# Patient Record
Sex: Female | Born: 2000 | Race: White | Hispanic: No | Marital: Single | Smoking: Never smoker
Health system: Southern US, Community
[De-identification: ages and names within clinical notes are randomized; demographics above are authoritative.]

## PROBLEM LIST (undated history)

## (undated) DIAGNOSIS — T7840XA Allergy, unspecified, initial encounter: Secondary | ICD-10-CM

## (undated) DIAGNOSIS — J45909 Unspecified asthma, uncomplicated: Secondary | ICD-10-CM

## (undated) DIAGNOSIS — F419 Anxiety disorder, unspecified: Secondary | ICD-10-CM

## (undated) HISTORY — DX: Unspecified asthma, uncomplicated: J45.909

## (undated) HISTORY — PX: SPINE SURGERY: SHX786

## (undated) HISTORY — DX: Allergy, unspecified, initial encounter: T78.40XA

## (undated) HISTORY — DX: Anxiety disorder, unspecified: F41.9

## (undated) HISTORY — PX: FRACTURE SURGERY: SHX138

## (undated) HISTORY — PX: BACK SURGERY: SHX140

---
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
## 2019-08-27 DIAGNOSIS — F419 Anxiety disorder, unspecified: Secondary | ICD-10-CM | POA: Diagnosis not present

## 2019-09-04 DIAGNOSIS — F419 Anxiety disorder, unspecified: Secondary | ICD-10-CM | POA: Diagnosis not present

## 2019-09-26 DIAGNOSIS — F419 Anxiety disorder, unspecified: Secondary | ICD-10-CM | POA: Diagnosis not present

## 2019-10-02 DIAGNOSIS — F419 Anxiety disorder, unspecified: Secondary | ICD-10-CM | POA: Diagnosis not present

## 2019-10-09 DIAGNOSIS — F419 Anxiety disorder, unspecified: Secondary | ICD-10-CM | POA: Diagnosis not present

## 2019-10-16 DIAGNOSIS — F419 Anxiety disorder, unspecified: Secondary | ICD-10-CM | POA: Diagnosis not present

## 2019-11-05 DIAGNOSIS — F419 Anxiety disorder, unspecified: Secondary | ICD-10-CM | POA: Diagnosis not present

## 2019-11-12 DIAGNOSIS — F419 Anxiety disorder, unspecified: Secondary | ICD-10-CM | POA: Diagnosis not present

## 2019-12-03 DIAGNOSIS — F419 Anxiety disorder, unspecified: Secondary | ICD-10-CM | POA: Diagnosis not present

## 2019-12-09 DIAGNOSIS — F419 Anxiety disorder, unspecified: Secondary | ICD-10-CM | POA: Diagnosis not present

## 2020-01-07 DIAGNOSIS — F419 Anxiety disorder, unspecified: Secondary | ICD-10-CM | POA: Diagnosis not present

## 2020-01-21 DIAGNOSIS — M9902 Segmental and somatic dysfunction of thoracic region: Secondary | ICD-10-CM | POA: Diagnosis not present

## 2020-01-21 DIAGNOSIS — M9901 Segmental and somatic dysfunction of cervical region: Secondary | ICD-10-CM | POA: Diagnosis not present

## 2020-01-21 DIAGNOSIS — M9905 Segmental and somatic dysfunction of pelvic region: Secondary | ICD-10-CM | POA: Diagnosis not present

## 2020-01-21 DIAGNOSIS — M9903 Segmental and somatic dysfunction of lumbar region: Secondary | ICD-10-CM | POA: Diagnosis not present

## 2020-01-24 DIAGNOSIS — M9905 Segmental and somatic dysfunction of pelvic region: Secondary | ICD-10-CM | POA: Diagnosis not present

## 2020-01-24 DIAGNOSIS — M9902 Segmental and somatic dysfunction of thoracic region: Secondary | ICD-10-CM | POA: Diagnosis not present

## 2020-01-24 DIAGNOSIS — M9903 Segmental and somatic dysfunction of lumbar region: Secondary | ICD-10-CM | POA: Diagnosis not present

## 2020-01-24 DIAGNOSIS — M9901 Segmental and somatic dysfunction of cervical region: Secondary | ICD-10-CM | POA: Diagnosis not present

## 2020-01-28 DIAGNOSIS — F419 Anxiety disorder, unspecified: Secondary | ICD-10-CM | POA: Diagnosis not present

## 2020-02-04 DIAGNOSIS — F419 Anxiety disorder, unspecified: Secondary | ICD-10-CM | POA: Diagnosis not present

## 2020-02-18 DIAGNOSIS — M545 Low back pain, unspecified: Secondary | ICD-10-CM | POA: Insufficient documentation

## 2020-02-18 DIAGNOSIS — F419 Anxiety disorder, unspecified: Secondary | ICD-10-CM | POA: Diagnosis not present

## 2020-02-28 DIAGNOSIS — M545 Low back pain: Secondary | ICD-10-CM | POA: Diagnosis not present

## 2020-03-03 DIAGNOSIS — F419 Anxiety disorder, unspecified: Secondary | ICD-10-CM | POA: Diagnosis not present

## 2020-03-04 DIAGNOSIS — M545 Low back pain: Secondary | ICD-10-CM | POA: Diagnosis not present

## 2020-03-04 DIAGNOSIS — M5126 Other intervertebral disc displacement, lumbar region: Secondary | ICD-10-CM | POA: Diagnosis not present

## 2020-03-17 DIAGNOSIS — M5417 Radiculopathy, lumbosacral region: Secondary | ICD-10-CM | POA: Diagnosis not present

## 2020-03-17 DIAGNOSIS — M5127 Other intervertebral disc displacement, lumbosacral region: Secondary | ICD-10-CM | POA: Diagnosis not present

## 2020-03-25 DIAGNOSIS — M5117 Intervertebral disc disorders with radiculopathy, lumbosacral region: Secondary | ICD-10-CM | POA: Diagnosis not present

## 2020-03-25 DIAGNOSIS — M5127 Other intervertebral disc displacement, lumbosacral region: Secondary | ICD-10-CM | POA: Diagnosis not present

## 2020-04-16 DIAGNOSIS — F419 Anxiety disorder, unspecified: Secondary | ICD-10-CM | POA: Diagnosis not present

## 2020-04-22 DIAGNOSIS — F419 Anxiety disorder, unspecified: Secondary | ICD-10-CM | POA: Diagnosis not present

## 2020-04-30 DIAGNOSIS — B078 Other viral warts: Secondary | ICD-10-CM | POA: Diagnosis not present

## 2020-04-30 DIAGNOSIS — F419 Anxiety disorder, unspecified: Secondary | ICD-10-CM | POA: Diagnosis not present

## 2020-05-06 DIAGNOSIS — F419 Anxiety disorder, unspecified: Secondary | ICD-10-CM | POA: Diagnosis not present

## 2020-05-19 DIAGNOSIS — F419 Anxiety disorder, unspecified: Secondary | ICD-10-CM | POA: Diagnosis not present

## 2020-05-21 DIAGNOSIS — J029 Acute pharyngitis, unspecified: Secondary | ICD-10-CM | POA: Diagnosis not present

## 2020-06-10 DIAGNOSIS — F419 Anxiety disorder, unspecified: Secondary | ICD-10-CM | POA: Diagnosis not present

## 2020-07-01 DIAGNOSIS — F419 Anxiety disorder, unspecified: Secondary | ICD-10-CM | POA: Diagnosis not present

## 2020-07-15 DIAGNOSIS — F419 Anxiety disorder, unspecified: Secondary | ICD-10-CM | POA: Diagnosis not present

## 2020-07-30 DIAGNOSIS — Z23 Encounter for immunization: Secondary | ICD-10-CM | POA: Diagnosis not present

## 2020-08-07 DIAGNOSIS — F419 Anxiety disorder, unspecified: Secondary | ICD-10-CM | POA: Diagnosis not present

## 2020-08-12 DIAGNOSIS — Z113 Encounter for screening for infections with a predominantly sexual mode of transmission: Secondary | ICD-10-CM | POA: Diagnosis not present

## 2020-08-12 DIAGNOSIS — N760 Acute vaginitis: Secondary | ICD-10-CM | POA: Diagnosis not present

## 2020-08-12 DIAGNOSIS — N920 Excessive and frequent menstruation with regular cycle: Secondary | ICD-10-CM | POA: Diagnosis not present

## 2020-08-28 DIAGNOSIS — M545 Low back pain, unspecified: Secondary | ICD-10-CM | POA: Diagnosis not present

## 2020-09-04 DIAGNOSIS — F419 Anxiety disorder, unspecified: Secondary | ICD-10-CM | POA: Diagnosis not present

## 2020-09-13 ENCOUNTER — Ambulatory Visit
Admission: EM | Admit: 2020-09-13 | Discharge: 2020-09-13 | Disposition: A | Discharge status: Home / Self Care | Payer: BC Managed Care – PPO | Attending: Family Medicine | Admitting: Family Medicine

## 2020-09-13 ENCOUNTER — Other Ambulatory Visit: Payer: Self-pay | Admitting: Family Medicine

## 2020-09-13 ENCOUNTER — Other Ambulatory Visit: Payer: Self-pay

## 2020-09-13 DIAGNOSIS — J069 Acute upper respiratory infection, unspecified: Secondary | ICD-10-CM | POA: Diagnosis not present

## 2020-09-13 DIAGNOSIS — B309 Viral conjunctivitis, unspecified: Secondary | ICD-10-CM | POA: Diagnosis not present

## 2020-09-13 LAB — POCT RAPID STREP A (OFFICE): Rapid Strep A Screen: NEGATIVE

## 2020-09-13 NOTE — ED Provider Notes (Signed)
[facility]    CSN: [id number] Arrival date & time: 09/13/20  5009      History   Chief Complaint Chief Complaint  Patient presents with  . Headache    Since tuesday  . Nasal Congestion    Since tuesday    HPI Maleiya Pergola Wardell is a 19 y.o. female.   She is presenting with headache, sore throat congestion.  Symptoms been ongoing for a few days.  Denies any fevers or chills.  Has received the Covid vaccine.  Denies any possible exposure to anyone with Covid.  HPI  History reviewed. No pertinent past medical history.  There are no problems to display for this patient.   History reviewed. No pertinent surgical history.  OB History   No obstetric history on file.      Home Medications    Prior to Admission medications   Not on File    Family History History reviewed. No pertinent family history.  Social History Social History   Tobacco Use  . Smoking status: Never Smoker  . Smokeless tobacco: Never Used  Vaping Use  . Vaping Use: Never used  Substance Use Topics  . Alcohol use: Yes  . Drug use: Never     Allergies   Codeine and Penicillins   Review of Systems Review of Systems  See HPI   Physical Exam Triage Vital Signs ED Triage Vitals  Enc Vitals Group     BP 09/13/20 1014 118/82     Pulse Rate 09/13/20 1014 (!) 102     Resp 09/13/20 1014 20     Temp 09/13/20 1014 98.1 F (36.7 C)     Temp Source 09/13/20 1014 Oral     SpO2 09/13/20 1014 98 %     Weight --      Height --      Head Circumference --      Peak Flow --      Pain Score 09/13/20 1025 6     Pain Loc --      Pain Edu? --      Excl. in GC? --    No data found.  Updated Vital Signs BP 118/82 (BP Location: Right Arm)   Pulse (!) 102   Temp 98.1 F (36.7 C) (Oral)   Resp 20   LMP 09/06/2020 (Exact Date)   SpO2 98%   Visual Acuity Right Eye Distance:   Left Eye Distance:   Bilateral Distance:    Right Eye Near:   Left Eye Near:    Bilateral  Near:     Physical Exam Gen: NAD, alert, cooperative with exam, ENT: normal lips, normal nasal mucosa,  Eye: normal EOM, injected right eye CV: S1-S2 Resp: no accessory muscle use, non-labored,  Skin: no rashes, no areas of induration  Neuro: normal tone, normal sensation to touch Psych:  normal insight, alert and oriented    UC Treatments / Results  Labs (all labs ordered are listed, but only abnormal results are displayed) Labs Reviewed  COVID-19, FLU A+B NAA  POCT RAPID STREP A (OFFICE)    EKG   Radiology No results found.  Procedures Procedures (including critical care time)  Medications Ordered in UC Medications - No data to display  Initial Impression / Assessment and Plan / UC Course  I have reviewed the triage vital signs and the nursing notes.  Pertinent labs & imaging results that were available during my care of the patient were reviewed by me and considered  in my medical decision making (see chart for details).     Ms. Segovia is a 19 year old female that is presenting with a red right eye which is likely related to a viral conjunctivitis.  There is no discharge from his eye.  Symptoms are likely related to upper respiratory infection.  Rapid strep was negative.  Covid swab and flu was obtained.  Counseled supportive care.  Given indications to follow-up.  Final Clinical Impressions(s) / UC Diagnoses   Final diagnoses:  Acute upper respiratory infection  Viral conjunctivitis of right eye     Discharge Instructions     Please try things such as zyrtec-D or allegra-D which is an antihistamine and decongestant.  Please try afrin which will help with nasal congestion but use for only three days.  Please also try using a netti pot on a regular occasion. Please try honey, vick's vapor rub, lozenges and humidifer for cough and sore throat  Please follow up if your symptoms fail to improve.     ED Prescriptions    None     PDMP not reviewed this  encounter.   Myra Rude, MD 09/13/20 1116

## 2020-09-13 NOTE — ED Triage Notes (Signed)
Patient states she has had nasal congestion, headache, and right eye redness and weeping. Right eye issues started today. Pt is aox4 and ambulatory.

## 2020-09-13 NOTE — Discharge Instructions (Signed)
Please try things such as zyrtec-D or allegra-D which is an antihistamine and decongestant.  Please try afrin which will help with nasal congestion but use for only three days.  Please also try using a netti pot on a regular occasion. Please try honey, vick's vapor rub, lozenges and humidifer for cough and sore throat   Please follow up if your symptoms fail to improve.  

## 2020-09-14 ENCOUNTER — Telehealth: Payer: Self-pay | Admitting: Physician Assistant

## 2020-09-14 DIAGNOSIS — H1033 Unspecified acute conjunctivitis, bilateral: Secondary | ICD-10-CM

## 2020-09-14 MED ORDER — OFLOXACIN 0.3 % OP SOLN: 1.0000 [drp] | Freq: Four times a day (QID) | OPHTHALMIC | 0 refills | Status: AC

## 2020-09-14 NOTE — Progress Notes (Signed)
E-Visit for Pink Eye   We are sorry that you are not feeling well.  Here is how we plan to help!  Based on what you have shared with me it looks like you have conjunctivitis.  Conjunctivitis is a common inflammatory or infectious condition of the eye that is often referred to as "pink eye".  In most cases it is contagious (viral or bacterial). However, not all conjunctivitis requires antibiotics (ex. Allergic).  We have made appropriate suggestions for you based upon your presentation.  I have prescribed Oflaxacin 1-2 drops 4 times a day times 5 days   Pink eye can be highly contagious.  It is typically spread through direct contact with secretions, or contaminated objects or surfaces that one may have touched.  Strict handwashing is suggested with soap and water is urged.  If not available, use alcohol based had sanitizer.  Avoid unnecessary touching of the eye.  If you wear contact lenses, you will need to refrain from wearing them until you see no white discharge from the eye for at least 24 hours after being on medication.  You should see symptom improvement in 1-2 days after starting the medication regimen.  Call us if symptoms are not improved in 1-2 days.  Home Care:  Wash your hands often!  Do not wear your contacts until you complete your treatment plan.  Avoid sharing towels, bed linen, personal items with a person who has pink eye.  See attention for anyone in your home with similar symptoms.  Get Help Right Away If:  Your symptoms do not improve.  You develop blurred or loss of vision.  Your symptoms worsen (increased discharge, pain or redness)  Your e-visit answers were reviewed by a board certified advanced clinical practitioner to complete your personal care plan.  Depending on the condition, your plan could have included both over the counter or prescription medications.  If there is a problem please reply  once you have received a response from your provider.  Your  safety is important to us.  If you have drug allergies check your prescription carefully.    You can use MyChart to ask questions about today's visit, request a non-urgent call back, or ask for a work or school excuse for 24 hours related to this e-Visit. If it has been greater than 24 hours you will need to follow up with your provider, or enter a new e-Visit to address those concerns.   You will get an e-mail in the next two days asking about your experience.  I hope that your e-visit has been valuable and will speed your recovery. Thank you for using e-visits.    Greater than 5 minutes, yet less than 10 minutes of time have been spent researching, coordinating, and implementing care for this patient today  

## 2020-09-15 ENCOUNTER — Telehealth: Payer: Self-pay | Admitting: Emergency Medicine

## 2020-09-15 DIAGNOSIS — J069 Acute upper respiratory infection, unspecified: Secondary | ICD-10-CM

## 2020-09-15 DIAGNOSIS — Z20828 Contact with and (suspected) exposure to other viral communicable diseases: Secondary | ICD-10-CM | POA: Diagnosis not present

## 2020-09-15 NOTE — Progress Notes (Signed)
Christine Velazquez,  I am sorry you are feeling so poorly and understand that you are very frustrated. I have reviewed your last 2 visits and I am in agreement with the 2 previous providers that this appears to be viral. Generally with bacterial infection you are going to have it localized to a single system. Given your conjunctivitis spreading from 1 eye to the next eye, you are giving an extraordinarily good history for viral infection. Viral infections can be persistent. You are not currently running any fevers. It is possible that you could have mono as you are in the correct age range. You were not tested for this at your visit however mono test generally do not come out positive until you have had symptoms for at least 10 days, and again it would be treated the same way with symptomatic treatment, fluids, cold medicine and rest. You currently have an antibiotic eyedrop and I do not think adding an oral antibiotic will help improve your symptoms in any way. At this point I would give it some more time and if you are still sick or worsen severely over the next few days I would go for a face-to-face reevaluation at your primary care doctor or an urgent care. You may specifically ask for mono testing. I am sorry that you are feeling so sick and I really hope you feel better soon. I am not sure that there is much more I can do is you seem to be on the correct medication.  We are sorry you are not feeling well.  Here is how we plan to help!  Based on what you have shared with me, it looks like you may have a viral upper respiratory infection.  Upper respiratory infections are caused by a large number of viruses; however, rhinovirus is the most common cause.   Symptoms vary from person to person, with common symptoms including sore throat, cough, fatigue or lack of energy and feeling of general discomfort.  A low-grade fever of up to 100.4 may present, but is often uncommon.  Symptoms vary however, and are closely  related to a person's age or underlying illnesses.  The most common symptoms associated with an upper respiratory infection are nasal discharge or congestion, cough, sneezing, headache and pressure in the ears and face.  These symptoms usually persist for about 3 to 10 days, but can last up to 2 weeks.  It is important to know that upper respiratory infections do not cause serious illness or complications in most cases.    Upper respiratory infections can be transmitted from person to person, with the most common method of transmission being a person's hands.  The virus is able to live on the skin and can infect other persons for up to 2 hours after direct contact.  Also, these can be transmitted when someone coughs or sneezes; thus, it is important to cover the mouth to reduce this risk.  To keep the spread of the illness at bay, good hand hygiene is very important.  This is an infection that is most likely caused by a virus. There are no specific treatments other than to help you with the symptoms until the infection runs its course.  We are sorry you are not feeling well.  Here is how we plan to help!   For nasal congestion, you may use an oral decongestants such as Mucinex D or if you have glaucoma or high blood pressure use plain Mucinex.  Saline nasal spray or nasal  drops can help and can safely be used as often as needed for congestion.    If you do not have a history of heart disease, hypertension, diabetes or thyroid disease, prostate/bladder issues or glaucoma, you may also use Sudafed to treat nasal congestion.  It is highly recommended that you consult with a pharmacist or your primary care physician to ensure this medication is safe for you to take.     If you have a cough, you may use cough suppressants such as Delsym and Robitussin.  If you have glaucoma or high blood pressure, you can also use Coricidin HBP.     If you have a sore or scratchy throat, use a saltwater gargle-  to   teaspoon of salt dissolved in a 4-ounce to 8-ounce glass of warm water.  Gargle the solution for approximately 15-30 seconds and then spit.  It is important not to swallow the solution.  You can also use throat lozenges/cough drops and Chloraseptic spray to help with throat pain or discomfort.  Warm or cold liquids can also be helpful in relieving throat pain.  For headache, pain or general discomfort, you can use Ibuprofen or Tylenol as directed.   Some authorities believe that zinc sprays or the use of Echinacea may shorten the course of your symptoms.   HOME CARE . Only take medications as instructed by your medical team. . Be sure to drink plenty of fluids. Water is fine as well as fruit juices, sodas and electrolyte beverages. You may want to stay away from caffeine or alcohol. If you are nauseated, try taking small sips of liquids. How do you know if you are getting enough fluid? Your urine should be a pale yellow or almost colorless. . Get rest. . Taking a steamy shower or using a humidifier may help nasal congestion and ease sore throat pain. You can place a towel over your head and breathe in the steam from hot water coming from a faucet. . Using a saline nasal spray works much the same way. . Cough drops, hard candies and sore throat lozenges may ease your cough. . Avoid close contacts especially the very young and the elderly . Cover your mouth if you cough or sneeze . Always remember to wash your hands.   GET HELP RIGHT AWAY IF: . You develop worsening fever. . If your symptoms do not improve within 10 days . You develop yellow or green discharge from your nose over 3 days. . You have coughing fits . You develop a severe head ache or visual changes. . You develop shortness of breath, difficulty breathing or start having chest pain . Your symptoms persist after you have completed your treatment plan  MAKE SURE YOU   Understand these instructions.  Will watch your  condition.  Will get help right away if you are not doing well or get worse.  Your e-visit answers were reviewed by a board certified advanced clinical practitioner to complete your personal care plan. Depending upon the condition, your plan could have included both over the counter or prescription medications. Please review your pharmacy choice. If there is a problem, you may call our nursing hot line at and have the prescription routed to another pharmacy. Your safety is important to [place]. If you have drug allergies check your prescription carefully.   You can use MyChart to ask questions about today's visit, request a non-urgent call back, or ask for a work or school excuse for 24 hours related to  this e-Visit. If it has been greater than 24 hours you will need to follow up with your provider, or enter a new e-Visit to address those concerns. You will get an e-mail in the next two days asking about your experience.  I hope that your e-visit has been valuable and will speed your recovery. Thank you for using e-visits.    **Please do not respond to this message unless you have follow up questions.** Greater than 5 but less than 10 minutes spent researching, coordinating, and implementing care for this patient today

## 2020-09-16 LAB — NOVEL CORONAVIRUS, NAA: SARS-CoV-2, NAA: NOT DETECTED

## 2020-09-22 ENCOUNTER — Other Ambulatory Visit: Payer: Self-pay

## 2020-09-22 ENCOUNTER — Ambulatory Visit
Admission: EM | Admit: 2020-09-22 | Discharge: 2020-09-22 | Disposition: A | Discharge status: Home / Self Care | Payer: BC Managed Care – PPO | Attending: Emergency Medicine | Admitting: Emergency Medicine

## 2020-09-22 DIAGNOSIS — J029 Acute pharyngitis, unspecified: Secondary | ICD-10-CM | POA: Insufficient documentation

## 2020-09-22 DIAGNOSIS — J020 Streptococcal pharyngitis: Secondary | ICD-10-CM | POA: Insufficient documentation

## 2020-09-22 LAB — POCT RAPID STREP A (OFFICE): Rapid Strep A Screen: NEGATIVE

## 2020-09-22 MED ORDER — LIDOCAINE VISCOUS HCL 2 % MT SOLN: 15.0000 mL | OROMUCOSAL | 0 refills | Status: DC | PRN

## 2020-09-22 NOTE — ED Triage Notes (Signed)
Pt states that she still has a sore throat. x9 days. Pt states that she is vaccinated.

## 2020-09-22 NOTE — ED Provider Notes (Signed)
[facility]    CSN: [id number] Arrival date & time: 09/22/20  1049      History   Chief Complaint Chief Complaint  Patient presents with  . Sore Throat    HPI Christine Velazquez is a 19 y.o. female  History was provided by the patient. Christine Velazquez is a 19 y.o. female who presents for evaluation of a sore throat. Associated symptoms include sore throat. Onset of symptoms was 2 weeks ago, unchanged since that time.  She is drinking plenty of fluids. She has not had recent close exposure to someone with proven streptococcal pharyngitis.  Has had negative covid test since symptom onset. The following portions of the patient's history were reviewed and updated as appropriate: allergies, current medications, past family history, past medical history, past social history, past surgical history and problem list.     History reviewed. No pertinent past medical history.  There are no problems to display for this patient.   Past Surgical History:  Procedure Laterality Date  . BACK SURGERY      OB History   No obstetric history on file.      Home Medications    Prior to Admission medications   Medication Sig Start Date End Date Taking? Authorizing Provider  lidocaine (XYLOCAINE) 2 % solution Use as directed 15 mLs in the mouth or throat as needed for mouth pain. 09/22/20  Yes Hall-Potvin, [place], PA-C    Family History History reviewed. No pertinent family history.  Social History Social History   Tobacco Use  . Smoking status: Never Smoker  . Smokeless tobacco: Never Used  Vaping Use  . Vaping Use: Never used  Substance Use Topics  . Alcohol use: Yes  . Drug use: Never     Allergies   Codeine and Penicillins   Review of Systems Review of Systems  Constitutional: Negative for fatigue and fever.  HENT: Positive for sore throat. Negative for congestion, dental problem, ear pain, facial swelling, hearing loss, sinus pain, trouble  swallowing and voice change.   Eyes: Negative for photophobia, pain and visual disturbance.  Respiratory: Negative for cough and shortness of breath.   Cardiovascular: Negative for chest pain and palpitations.  Gastrointestinal: Negative for diarrhea and vomiting.  Musculoskeletal: Negative for arthralgias and myalgias.  Neurological: Negative for dizziness and headaches.     Physical Exam Triage Vital Signs ED Triage Vitals  Enc Vitals Group     BP 09/22/20 1321 121/85     Pulse Rate 09/22/20 1321 (!) 102     Resp --      Temp 09/22/20 1321 98 F (36.7 C)     Temp Source 09/22/20 1321 Oral     SpO2 09/22/20 1321 98 %     Weight 09/22/20 1320 160 lb (72.6 kg)     Height 09/22/20 1320 5\' 3"  (1.6 m)     Head Circumference --      Peak Flow --      Pain Score 09/22/20 1320 7     Pain Loc --      Pain Edu? --      Excl. in GC? --    No data found.  Updated Vital Signs BP 121/85 (BP Location: Left Arm)   Pulse (!) 102   Temp 98 F (36.7 C) (Oral)   Ht 5\' 3"  (1.6 m)   Wt 160 lb (72.6 kg)   LMP 09/06/2020 (Exact Date)   SpO2 98%   BMI 28.34 kg/m  Visual Acuity Right Eye Distance:   Left Eye Distance:   Bilateral Distance:    Right Eye Near:   Left Eye Near:    Bilateral Near:     Physical Exam Constitutional:      General: She is not in acute distress. HENT:     Head: Normocephalic and atraumatic.     Jaw: There is normal jaw occlusion. No tenderness or pain on movement.     Right Ear: Hearing, tympanic membrane, ear canal and external ear normal. No tenderness. No mastoid tenderness.     Left Ear: Hearing, tympanic membrane, ear canal and external ear normal. No tenderness. No mastoid tenderness.     Nose: No nasal deformity, septal deviation or nasal tenderness.     Right Turbinates: Not swollen or pale.     Left Turbinates: Not swollen or pale.     Right Sinus: No maxillary sinus tenderness or frontal sinus tenderness.     Left Sinus: No maxillary sinus  tenderness or frontal sinus tenderness.     Mouth/Throat:     Lips: Pink. No lesions.     Mouth: Mucous membranes are moist. No injury.     Pharynx: Oropharynx is clear. Uvula midline. No posterior oropharyngeal erythema or uvula swelling.     Comments: 2+ tonsils bilaterally with scant exudate. Cardiovascular:     Rate and Rhythm: Normal rate.  Pulmonary:     Effort: Pulmonary effort is normal.  Musculoskeletal:     Cervical back: Normal range of motion and neck supple. No muscular tenderness.  Lymphadenopathy:     Cervical: No cervical adenopathy.  Neurological:     Mental Status: She is alert and oriented to person, place, and time.      UC Treatments / Results  Labs (all labs ordered are listed, but only abnormal results are displayed) Labs Reviewed  CULTURE, GROUP A STREP [facility])  POCT RAPID STREP A (OFFICE)    EKG   Radiology No results found.  Procedures Procedures (including critical care time)  Medications Ordered in UC Medications - No data to display  Initial Impression / Assessment and Plan / UC Course  I have reviewed the triage vital signs and the nursing notes.  Pertinent labs & imaging results that were available during my care of the patient were reviewed by me and considered in my medical decision making (see chart for details).     Patient afebrile, nontoxic in office today.  Rapid strep negative, culture pending.  Will await culture results, treat supportively in the interim as this is likely viral.  Return precautions discussed, pt verbalized understanding and is agreeable to plan. Final Clinical Impressions(s) / UC Diagnoses   Final diagnoses:  Streptococcal sore throat  Sore throat     Discharge Instructions     Your rapid strep test was negative today.  The culture is pending.  Please look on your MyChart for test results.   We will notify you if the culture positive and outline a treatment plan at that time.   Please continue  Tylenol and/or Ibuprofen as needed for fever, pain.  May try warm salt water gargles, cepacol lozenges, throat spray, warm tea or water with lemon/honey, or OTC cold relief medicine for throat discomfort.  May gargle, spit viscous lidocaine as prescribed for additional relief.  (Please note this may cause the back of your tongue and mouth to be numb as well)  For congestion: take a daily anti-histamine like Zyrtec, Claritin, and a oral  decongestant to help with post nasal drip that may be irritating your throat.   It is important to stay hydrated: drink plenty of fluids (primarily water) to keep your throat moisturized and help further relieve irritation/discomfort.     ED Prescriptions    Medication Sig Dispense Auth. Provider   lidocaine (XYLOCAINE) 2 % solution Use as directed 15 mLs in the mouth or throat as needed for mouth pain. 100 mL Hall-Potvin, [place], PA-C     PDMP not reviewed this encounter.   Hall-Potvin, [place], [place] 09/22/20 1403

## 2020-09-22 NOTE — Discharge Instructions (Addendum)
Your rapid strep test was negative today.  The culture is pending.  Please look on your MyChart for test results.   We will notify you if the culture positive and outline a treatment plan at that time.   Please continue Tylenol and/or Ibuprofen as needed for fever, pain.  May try warm salt water gargles, cepacol lozenges, throat spray, warm tea or water with lemon/honey, or OTC cold relief medicine for throat discomfort.  May gargle, spit viscous lidocaine as prescribed for additional relief.  (Please note this may cause the back of your tongue and mouth to be numb as well)  For congestion: take a daily anti-histamine like Zyrtec, Claritin, and a oral decongestant to help with post nasal drip that may be irritating your throat.   It is important to stay hydrated: drink plenty of fluids (primarily water) to keep your throat moisturized and help further relieve irritation/discomfort.  

## 2020-09-24 ENCOUNTER — Telehealth (HOSPITAL_COMMUNITY): Payer: Self-pay | Admitting: Emergency Medicine

## 2020-09-24 LAB — CULTURE, GROUP A STREP (THRC)

## 2020-09-24 MED ORDER — AZITHROMYCIN 250 MG PO TABS: 250.0000 mg | ORAL_TABLET | Freq: Every day | ORAL | 0 refills | Status: DC

## 2020-09-30 ENCOUNTER — Other Ambulatory Visit: Payer: Self-pay

## 2020-09-30 ENCOUNTER — Ambulatory Visit: Payer: Self-pay | Admitting: Dermatology

## 2020-09-30 ENCOUNTER — Encounter: Payer: Self-pay | Admitting: Physician Assistant

## 2020-09-30 ENCOUNTER — Ambulatory Visit (INDEPENDENT_AMBULATORY_CARE_PROVIDER_SITE_OTHER): Payer: BC Managed Care – PPO | Admitting: Physician Assistant

## 2020-09-30 DIAGNOSIS — B079 Viral wart, unspecified: Secondary | ICD-10-CM | POA: Diagnosis not present

## 2020-10-02 ENCOUNTER — Encounter: Payer: Self-pay | Admitting: Physician Assistant

## 2020-10-02 NOTE — Progress Notes (Addendum)
   New Patient   Subjective  Christine Velazquez is a 20 y.o. female who presents for the following: New Patient (Initial Visit) (Patient here today for wart on right middle finger nail bed x years.  Per patient it was LN2 last August by another Dermatologist in [place] and it didn't improve. Per patient it is irritating.).   The following portions of the chart were reviewed this encounter and updated as appropriate:  Tobacco  Allergies  Meds  Problems  Med Hx  Surg Hx  Fam Hx      Objective  Well appearing patient in no apparent distress; mood and affect are within normal limits.  A focused examination was performed including hands, back and face. Relevant physical exam findings are noted in the Assessment and Plan.  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  Patient will continue cantharone  at home as tolerated  She had some at home.. Pt. To call if problems or questions. Warned of permanent fingernail damage, scar, and failure.  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 The documentation on 11/05/20 for the exam, diagnosis, procedures, and orders are all accurate and complete.Marland Kitchen

## 2021-12-14 LAB — OB RESULTS CONSOLE GC/CHLAMYDIA: Chlamydia: NEGATIVE

## 2021-12-14 LAB — HM PAP SMEAR

## 2023-01-09 ENCOUNTER — Encounter: Payer: Self-pay | Admitting: *Deleted

## 2024-05-23 ENCOUNTER — Ambulatory Visit: Admitting: Family Medicine

## 2024-05-28 ENCOUNTER — Encounter: Payer: Self-pay | Admitting: Family Medicine

## 2024-05-28 ENCOUNTER — Ambulatory Visit (INDEPENDENT_AMBULATORY_CARE_PROVIDER_SITE_OTHER): Admitting: Family Medicine

## 2024-05-28 VITALS — BP 125/78 | HR 60 | Temp 98.0°F | Resp 18 | Ht 64.0 in | Wt 165.2 lb

## 2024-05-28 DIAGNOSIS — Z23 Encounter for immunization: Secondary | ICD-10-CM | POA: Diagnosis not present

## 2024-05-28 DIAGNOSIS — Z0000 Encounter for general adult medical examination without abnormal findings: Secondary | ICD-10-CM | POA: Diagnosis not present

## 2024-05-28 DIAGNOSIS — R7302 Impaired glucose tolerance (oral): Secondary | ICD-10-CM | POA: Diagnosis not present

## 2024-05-28 DIAGNOSIS — Z13220 Encounter for screening for lipoid disorders: Secondary | ICD-10-CM

## 2024-05-28 DIAGNOSIS — Z136 Encounter for screening for cardiovascular disorders: Secondary | ICD-10-CM

## 2024-05-28 DIAGNOSIS — Z7689 Persons encountering health services in other specified circumstances: Secondary | ICD-10-CM

## 2024-05-28 NOTE — Progress Notes (Signed)
 Complete physical exam and Establish care  Patient: Christine Velazquez   DOB: May 19, 2001   23 y.o. Female  MRN: [id number]  Subjective:    Chief Complaint  Patient presents with   Establish Care    Patient is here to establish care with a new PCP   Annual Exam    Christine Velazquez is a 23 y.o. female who presents today for a complete physical exam. She reports consuming a general diet. Active at home She generally feels well. She reports sleeping fairly well. She does not have additional problems to discuss today.  She's had pap done this year.  Pt would like flu vaccine today.   Most recent fall risk assessment:    05/28/2024    9:01 AM  Fall Risk   Falls in the past year? 0  Number falls in past yr: 0  Injury with Fall? 0  Risk for fall due to : No Fall Risks  Follow up Falls evaluation completed     Most recent depression screenings:    05/28/2024    9:01 AM  PHQ 2/9 Scores  PHQ - 2 Score 0  PHQ- 9 Score 1    Vision:Within last year  Patient Active Problem List   Diagnosis Date Noted   Low back pain 02/18/2020   Past Medical History:  Diagnosis Date   Allergy 2009   Anxiety 2020   Asthma 2015   Social History   Tobacco Use   Smoking status: Never    Passive exposure: Never   Smokeless tobacco: Never  Vaping Use   Vaping status: Never Used  Substance Use Topics   Alcohol use: Yes    Comment: dont drink on a weekly basis   Drug use: Never   Family History  Problem Relation Age of Onset   Cancer Maternal Grandmother    Cancer Paternal Grandmother    Allergies  Allergen Reactions   Codeine Nausea Only   Penicillins Hives      Patient Care Team: Patient, No Pcp Per as PCP - General (General Practice) [place], Andrez SAUNDERS, PA-C (Inactive) as Physician Assistant (Dermatology)   Outpatient Medications Prior to Visit  Medication Sig   norethindrone-ethinyl estradiol (LOESTRIN FE) 1-20 MG-MCG tablet Take 1 tablet by mouth daily.   No  facility-administered medications prior to visit.    Review of Systems  All other systems reviewed and are negative.         Objective:     BP 125/78   Pulse 60   Temp 98 F (36.7 C) (Oral)   Resp 18   Ht 5' 4 (1.626 m)   Wt 165 lb 3.2 oz (74.9 kg)   LMP 05/16/2024 (Exact Date)   SpO2 98%   BMI 28.36 kg/m  BP Readings from Last 3 Encounters:  05/28/24 125/78  09/22/20 121/85  09/13/20 118/82      Physical Exam Vitals and nursing note reviewed.  Constitutional:      Appearance: Normal appearance. She is normal weight.  HENT:     Head: Normocephalic and atraumatic.     Right Ear: Tympanic membrane, ear canal and external ear normal.     Left Ear: Tympanic membrane, ear canal and external ear normal.     Nose: Nose normal.     Mouth/Throat:     Mouth: Mucous membranes are moist.     Pharynx: Oropharynx is clear.  Eyes:     Conjunctiva/sclera: Conjunctivae normal.     Pupils: Pupils  are equal, round, and reactive to light.  Cardiovascular:     Rate and Rhythm: Normal rate and regular rhythm.     Pulses: Normal pulses.     Heart sounds: Normal heart sounds.  Pulmonary:     Effort: Pulmonary effort is normal.     Breath sounds: Normal breath sounds.  Abdominal:     General: Abdomen is flat. Bowel sounds are normal.  Skin:    General: Skin is warm.     Capillary Refill: Capillary refill takes less than 2 seconds.  Neurological:     General: No focal deficit present.     Mental Status: She is alert and oriented to person, place, and time. Mental status is at baseline.  Psychiatric:        Mood and Affect: Mood normal.        Behavior: Behavior normal.        Thought Content: Thought content normal.        Judgment: Judgment normal.     No results found for any visits on 05/28/24. Last CBC No results found for: WBC, HGB, HCT, MCV, MCH, RDW, PLT Last metabolic panel No results found for: GLUCOSE, NA, K, CL, CO2, BUN,  CREATININE, EGFR, CALCIUM, PHOS, PROT, ALBUMIN, LABGLOB, AGRATIO, BILITOT, ALKPHOS, AST, ALT, ANIONGAP Last lipids No results found for: CHOL, HDL, LDLCALC, LDLDIRECT, TRIG, CHOLHDL Last hemoglobin A1c No results found for: HGBA1C      Assessment & Plan:    Routine Health Maintenance and Physical Exam  Immunization History  Administered Date(s) Administered   Influenza Inj Mdck Quad Pf 10/04/2016, 06/24/2018, 07/30/2020    Health Maintenance  Topic Date Due   CHLAMYDIA SCREENING  Never done   HPV VACCINES (1 - 3-dose series) Never done   HIV Screening  Never done   Hepatitis C Screening  Never done   DTaP/Tdap/Td (1 - Tdap) Never done   Hepatitis B Vaccines 19-59 Average Risk (1 of 3 - 19+ 3-dose series) Never done   Cervical Cancer Screening (Pap smear)  Never done   INFLUENZA VACCINE  04/26/2024   COVID-19 Vaccine (1 - 2024-25 season) Never done   Pneumococcal Vaccine  Aged Out   Meningococcal B Vaccine  Aged Out    Discussed health benefits of physical activity, and encouraged her to engage in regular exercise appropriate for her age and condition.  Problem List Items Addressed This Visit   None  No follow-ups on file. Annual physical exam  Encounter to establish care with new doctor  Encounter for lipid screening for cardiovascular disease -     Lipid panel  Impaired glucose tolerance -     CBC with Differential/Platelet -     Comprehensive metabolic panel with GFR -     Hemoglobin A1c  Need for influenza vaccination   Pt here to establish care and CPE.  Labs done today with flu vaccine See in 1 year sooner prn.     Torrence CINDERELLA Barrier, MD

## 2024-05-29 LAB — COMPREHENSIVE METABOLIC PANEL WITH GFR
ALT: 23 IU/L (ref 0–32)
AST: 19 IU/L (ref 0–40)
Albumin: 4.6 g/dL (ref 4.0–5.0)
Alkaline Phosphatase: 65 IU/L (ref 44–121)
BUN/Creatinine Ratio: 12 (ref 9–23)
BUN: 11 mg/dL (ref 6–20)
Bilirubin Total: 0.6 mg/dL (ref 0.0–1.2)
CO2: 19 mmol/L — ABNORMAL LOW (ref 20–29)
Calcium: 9.7 mg/dL (ref 8.7–10.2)
Chloride: 105 mmol/L (ref 96–106)
Creatinine, Ser: 0.89 mg/dL (ref 0.57–1.00)
Globulin, Total: 3.1 g/dL (ref 1.5–4.5)
Glucose: 85 mg/dL (ref 70–99)
Potassium: 5 mmol/L (ref 3.5–5.2)
Sodium: 140 mmol/L (ref 134–144)
Total Protein: 7.7 g/dL (ref 6.0–8.5)
eGFR: 93 mL/min/1.73 (ref >59)

## 2024-05-29 LAB — CBC WITH DIFFERENTIAL/PLATELET
Basophils Absolute: 0.1 x10E3/uL (ref 0.0–0.2)
Basos: 1 %
EOS (ABSOLUTE): 0.3 x10E3/uL (ref 0.0–0.4)
Eos: 4 %
Hematocrit: 45.8 % (ref 34.0–46.6)
Hemoglobin: 15.3 g/dL (ref 11.1–15.9)
Immature Grans (Abs): 0 x10E3/uL (ref 0.0–0.1)
Immature Granulocytes: 0 %
Lymphocytes Absolute: 2.1 x10E3/uL (ref 0.7–3.1)
Lymphs: 28 %
MCH: 31.8 pg (ref 26.6–33.0)
MCHC: 33.4 g/dL (ref 31.5–35.7)
MCV: 95 fL (ref 79–97)
Monocytes Absolute: 0.3 x10E3/uL (ref 0.1–0.9)
Monocytes: 4 %
Neutrophils Absolute: 4.9 x10E3/uL (ref 1.4–7.0)
Neutrophils: 63 %
Platelets: 293 x10E3/uL (ref 150–450)
RBC: 4.81 x10E6/uL (ref 3.77–5.28)
RDW: 11.9 % (ref 11.7–15.4)
WBC: 7.7 x10E3/uL (ref 3.4–10.8)

## 2024-05-29 LAB — LIPID PANEL
Chol/HDL Ratio: 3.4 ratio (ref 0.0–4.4)
Cholesterol, Total: 209 mg/dL — ABNORMAL HIGH (ref 100–199)
HDL: 61 mg/dL (ref >39)
LDL Chol Calc (NIH): 124 mg/dL — ABNORMAL HIGH (ref 0–99)
Triglycerides: 134 mg/dL (ref 0–149)
VLDL Cholesterol Cal: 24 mg/dL (ref 5–40)

## 2024-05-29 LAB — HEMOGLOBIN A1C
Est. average glucose Bld gHb Est-mCnc: 103 mg/dL
Hgb A1c MFr Bld: 5.2 % (ref 4.8–5.6)

## 2024-05-30 ENCOUNTER — Ambulatory Visit: Payer: Self-pay | Admitting: Family Medicine

## 2024-06-05 ENCOUNTER — Ambulatory Visit
Admission: RE | Admit: 2024-06-05 | Discharge: 2024-06-05 | Disposition: A | Discharge status: Home / Self Care | Attending: Family Medicine | Admitting: Family Medicine

## 2024-06-05 VITALS — BP 121/78 | HR 109 | Temp 98.2°F | Resp 17

## 2024-06-05 DIAGNOSIS — H109 Unspecified conjunctivitis: Secondary | ICD-10-CM

## 2024-06-05 DIAGNOSIS — J0100 Acute maxillary sinusitis, unspecified: Secondary | ICD-10-CM

## 2024-06-05 MED ORDER — SULFACETAMIDE SODIUM 10 % OP SOLN: 2.0000 [drp] | Freq: Three times a day (TID) | OPHTHALMIC | 0 refills | Status: AC

## 2024-06-05 MED ORDER — AZITHROMYCIN 250 MG PO TABS: 250.0000 mg | ORAL_TABLET | Freq: Every day | ORAL | 0 refills | Status: AC

## 2024-06-05 NOTE — Discharge Instructions (Addendum)
 Advised patient to take medication as directed with food to completion.  Advised patient to instill eyedrop in right eye as directed.  Advised if symptoms worsen and/or unresolved please follow-up with your PCP, ophthalmology/optometry, or here for further evaluation.

## 2024-06-05 NOTE — ED Triage Notes (Signed)
 Pt c/o cough, congestion and RT eye drainage since Sunday am. Tested pos for COVID on 8/25. Felt well for a week. Most recent covid test on 8th was neg. Taking ibuprofen and acetaminophen prn.

## 2024-06-05 NOTE — ED Provider Notes (Signed)
 [facility]    CSN: [id number] Arrival date & time: 06/05/24  1312      History   Chief Complaint Chief Complaint  Patient presents with   Nasal Congestion   Cough   Eye Problem    HPI Christine Velazquez is a 22 y.o. female.   HPI 23 year old female presents with cough, congestion and right eye drainage since Sunday morning, 06/02/2024.[name]  Patient reports was positive for COVID-19 on 05/20/2024.  Patient reports she was negative for COVID-19 on 9 8/25.  PMH significant for asthma and anxiety.  Past Medical History:  Diagnosis Date   Allergy 2009   Anxiety 2020   Asthma 2015    Patient Active Problem List   Diagnosis Date Noted   Low back pain 02/18/2020    Past Surgical History:  Procedure Laterality Date   BACK SURGERY     FRACTURE SURGERY  2009   SPINE SURGERY  2021    OB History   No obstetric history on file.      Home Medications    Prior to Admission medications   Medication Sig Start Date End Date Taking? Authorizing Provider  azithromycin  (ZITHROMAX ) 250 MG tablet Take 1 tablet (250 mg total) by mouth daily. Take first 2 tablets together, then 1 every day until finished. 06/05/24  Yes Teddy Sharper, FNP  sulfacetamide  (BLEPH -10) 10 % ophthalmic solution Place 2 drops into the right eye in the morning, at noon, and at bedtime. 06/05/24  Yes Teddy Sharper, FNP  norethindrone-ethinyl estradiol (LOESTRIN FE) 1-20 MG-MCG tablet Take 1 tablet by mouth daily. 08/12/20   [provider]    Family History Family History  Problem Relation Age of Onset   Cancer Maternal Grandmother    Cancer Paternal Grandmother     Social History Social History   Tobacco Use   Smoking status: Never    Passive exposure: Never   Smokeless tobacco: Never  Vaping Use   Vaping status: Never Used  Substance Use Topics   Alcohol use: Yes    Comment: dont drink on a weekly basis   Drug use: Never     Allergies   Codeine and Penicillins   Review  of Systems Review of Systems  HENT:  Positive for congestion.   Respiratory:  Positive for cough.   All other systems reviewed and are negative.    Physical Exam Triage Vital Signs ED Triage Vitals  Encounter Vitals Group     BP      Girls Systolic BP Percentile      Girls Diastolic BP Percentile      Boys Systolic BP Percentile      Boys Diastolic BP Percentile      Pulse      Resp      Temp      Temp src      SpO2      Weight      Height      Head Circumference      Peak Flow      Pain Score      Pain Loc      Pain Education      Exclude from Growth Chart    No data found.  Updated Vital Signs BP 121/78 (BP Location: Right Arm)   Pulse (!) 109   Temp 98.2 F (36.8 C) (Oral)   Resp 17   LMP 05/16/2024 (Exact Date)   SpO2 97%   Visual Acuity Right Eye Distance:  Left Eye Distance:   Bilateral Distance:    Right Eye Near:   Left Eye Near:    Bilateral Near:     Physical Exam Vitals and nursing note reviewed.  Constitutional:      Appearance: Normal appearance. She is normal weight. She is not ill-appearing.  HENT:     Head: Normocephalic and atraumatic.     Right Ear: Tympanic membrane, ear canal and external ear normal.     Left Ear: Tympanic membrane and external ear normal.     Ears:     Comments: Significant compression of bilateral external auditory canals bilaterally    Mouth/Throat:     Mouth: Mucous membranes are moist.     Pharynx: Oropharynx is clear.  Eyes:     Extraocular Movements: Extraocular movements intact.     Conjunctiva/sclera: Conjunctivae normal.     Pupils: Pupils are equal, round, and reactive to light.     Comments: Bilateral eyes are within normal limits today on exam patient reports earlier this morning right eye was injected red with mucopurulent discharge at inner canthus  Cardiovascular:     Rate and Rhythm: Normal rate and regular rhythm.     Pulses: Normal pulses.     Heart sounds: Normal heart sounds.   Pulmonary:     Effort: Pulmonary effort is normal.     Breath sounds: Normal breath sounds. No wheezing, rhonchi or rales.  Musculoskeletal:        General: Normal range of motion.  Skin:    General: Skin is warm and dry.  Neurological:     General: No focal deficit present.     Mental Status: She is alert and oriented to person, place, and time. Mental status is at baseline.  Psychiatric:        Mood and Affect: Mood normal.        Behavior: Behavior normal.      UC Treatments / Results  Labs (all labs ordered are listed, but only abnormal results are displayed) Labs Reviewed - No data to display  EKG   Radiology No results found.  Procedures Procedures (including critical care time)  Medications Ordered in UC Medications - No data to display  Initial Impression / Assessment and Plan / UC Course  I have reviewed the triage vital signs and the nursing notes.  Pertinent labs & imaging results that were available during my care of the patient were reviewed by me and considered in my medical decision making (see chart for details).     MDM: 1.  Subacute maxillary sinusitis-Rx'd Zithromax  (500 mg day 1, then 250 mg day 2-5); 2.  Conjunctivitis of right eye, unspecified conjunctivitis type-Rx'd sulfacetamide  10% ophthalmic solution: Place 2 drops into right eye morning, noon and bedtime x 5 days. Advised patient to take medication as directed with food to completion.  Advised patient to instill eyedrop in right eye as directed.  Advised if symptoms worsen and/or unresolved please follow-up with your PCP, ophthalmology/optometry, or here for further evaluation.  Patient discharged home, hemodynamically stable. Final Clinical Impressions(s) / UC Diagnoses   Final diagnoses:  Subacute maxillary sinusitis  Conjunctivitis of right eye, unspecified conjunctivitis type     Discharge Instructions      Advised patient to take medication as directed with food to completion.   Advised patient to instill eyedrop in right eye as directed.  Advised if symptoms worsen and/or unresolved please follow-up with your PCP, ophthalmology/optometry, or here for further evaluation.  ED Prescriptions     Medication Sig Dispense Auth. Provider   azithromycin  (ZITHROMAX ) 250 MG tablet Take 1 tablet (250 mg total) by mouth daily. Take first 2 tablets together, then 1 every day until finished. 6 tablet Cauy Melody, FNP   sulfacetamide  (BLEPH -10) 10 % ophthalmic solution Place 2 drops into the right eye in the morning, at noon, and at bedtime. 5 mL Chee Dimon, FNP      PDMP not reviewed this encounter.   Teddy Sharper, FNP 06/05/24 1353
